# Patient Record
Sex: Male | Born: 2005 | Race: Black or African American | Hispanic: No | Marital: Single | Smoking: Never smoker
Health system: Southern US, Community
[De-identification: ages and names within clinical notes are randomized; demographics above are authoritative.]

## PROBLEM LIST (undated history)

## (undated) DIAGNOSIS — J45909 Unspecified asthma, uncomplicated: Secondary | ICD-10-CM

## (undated) HISTORY — PX: OTHER SURGICAL HISTORY: SHX169

---
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
## 2024-04-25 ENCOUNTER — Encounter: Payer: Self-pay | Admitting: Sports Medicine
